# Patient Record
Sex: Male | Born: 1960 | Race: White | Smoking: Never smoker
Health system: Southern US, Community
[De-identification: ages and names within clinical notes are randomized; demographics above are authoritative.]

---

## 2011-07-06 ENCOUNTER — Ambulatory Visit: Payer: Worker's Compensation | Attending: Specialist | Admitting: Physical Therapy

## 2011-07-06 DIAGNOSIS — M542 Cervicalgia: Secondary | ICD-10-CM | POA: Insufficient documentation

## 2011-07-06 DIAGNOSIS — M25519 Pain in unspecified shoulder: Secondary | ICD-10-CM | POA: Insufficient documentation

## 2011-07-06 DIAGNOSIS — M25619 Stiffness of unspecified shoulder, not elsewhere classified: Secondary | ICD-10-CM | POA: Insufficient documentation

## 2011-07-06 DIAGNOSIS — M6281 Muscle weakness (generalized): Secondary | ICD-10-CM | POA: Insufficient documentation

## 2011-07-06 DIAGNOSIS — IMO0001 Reserved for inherently not codable concepts without codable children: Secondary | ICD-10-CM | POA: Insufficient documentation

## 2011-07-07 ENCOUNTER — Ambulatory Visit: Payer: Worker's Compensation | Admitting: Physical Therapy

## 2011-07-10 ENCOUNTER — Ambulatory Visit: Payer: Worker's Compensation | Admitting: Physical Therapy

## 2011-07-12 ENCOUNTER — Ambulatory Visit: Payer: Worker's Compensation | Admitting: Physical Therapy

## 2011-07-17 ENCOUNTER — Encounter: Payer: Worker's Compensation | Admitting: Physical Therapy

## 2011-07-18 ENCOUNTER — Ambulatory Visit: Payer: Worker's Compensation | Admitting: Physical Therapy

## 2011-07-19 ENCOUNTER — Encounter: Payer: Worker's Compensation | Admitting: Physical Therapy

## 2011-07-20 ENCOUNTER — Ambulatory Visit: Payer: Worker's Compensation | Admitting: Physical Therapy

## 2011-07-24 ENCOUNTER — Ambulatory Visit: Payer: Worker's Compensation | Admitting: Physical Therapy

## 2011-07-26 ENCOUNTER — Ambulatory Visit: Payer: Worker's Compensation | Attending: Specialist | Admitting: Physical Therapy

## 2011-07-26 DIAGNOSIS — M542 Cervicalgia: Secondary | ICD-10-CM | POA: Insufficient documentation

## 2011-07-26 DIAGNOSIS — M6281 Muscle weakness (generalized): Secondary | ICD-10-CM | POA: Insufficient documentation

## 2011-07-26 DIAGNOSIS — IMO0001 Reserved for inherently not codable concepts without codable children: Secondary | ICD-10-CM | POA: Insufficient documentation

## 2011-07-26 DIAGNOSIS — M25519 Pain in unspecified shoulder: Secondary | ICD-10-CM | POA: Insufficient documentation

## 2011-07-26 DIAGNOSIS — M25619 Stiffness of unspecified shoulder, not elsewhere classified: Secondary | ICD-10-CM | POA: Insufficient documentation

## 2011-07-31 ENCOUNTER — Ambulatory Visit: Payer: Worker's Compensation | Attending: Specialist | Admitting: Physical Therapy

## 2011-07-31 DIAGNOSIS — IMO0001 Reserved for inherently not codable concepts without codable children: Secondary | ICD-10-CM | POA: Insufficient documentation

## 2011-07-31 DIAGNOSIS — M542 Cervicalgia: Secondary | ICD-10-CM | POA: Insufficient documentation

## 2011-07-31 DIAGNOSIS — M25619 Stiffness of unspecified shoulder, not elsewhere classified: Secondary | ICD-10-CM | POA: Insufficient documentation

## 2011-07-31 DIAGNOSIS — M25519 Pain in unspecified shoulder: Secondary | ICD-10-CM | POA: Insufficient documentation

## 2011-07-31 DIAGNOSIS — M6281 Muscle weakness (generalized): Secondary | ICD-10-CM | POA: Insufficient documentation

## 2011-08-02 ENCOUNTER — Encounter: Payer: Worker's Compensation | Admitting: Physical Therapy

## 2011-08-03 ENCOUNTER — Ambulatory Visit: Payer: Worker's Compensation | Admitting: Physical Therapy

## 2011-08-07 ENCOUNTER — Ambulatory Visit: Payer: Worker's Compensation | Admitting: Physical Therapy

## 2011-08-24 ENCOUNTER — Ambulatory Visit: Payer: Worker's Compensation | Admitting: Physical Therapy

## 2011-08-28 ENCOUNTER — Ambulatory Visit: Payer: Worker's Compensation | Attending: Specialist | Admitting: Physical Therapy

## 2011-08-28 DIAGNOSIS — M542 Cervicalgia: Secondary | ICD-10-CM | POA: Insufficient documentation

## 2011-08-28 DIAGNOSIS — M6281 Muscle weakness (generalized): Secondary | ICD-10-CM | POA: Insufficient documentation

## 2011-08-28 DIAGNOSIS — IMO0001 Reserved for inherently not codable concepts without codable children: Secondary | ICD-10-CM | POA: Insufficient documentation

## 2011-08-28 DIAGNOSIS — M25519 Pain in unspecified shoulder: Secondary | ICD-10-CM | POA: Insufficient documentation

## 2011-08-28 DIAGNOSIS — M25619 Stiffness of unspecified shoulder, not elsewhere classified: Secondary | ICD-10-CM | POA: Insufficient documentation

## 2011-08-30 ENCOUNTER — Ambulatory Visit: Payer: Worker's Compensation | Admitting: Physical Therapy

## 2011-09-01 ENCOUNTER — Ambulatory Visit: Payer: Worker's Compensation | Admitting: Physical Therapy

## 2011-09-04 ENCOUNTER — Ambulatory Visit: Payer: Worker's Compensation | Admitting: Physical Therapy

## 2011-09-06 ENCOUNTER — Ambulatory Visit: Payer: Worker's Compensation | Admitting: Physical Therapy

## 2011-09-08 ENCOUNTER — Ambulatory Visit: Payer: Worker's Compensation | Admitting: Physical Therapy

## 2011-09-11 ENCOUNTER — Ambulatory Visit: Payer: Worker's Compensation | Admitting: Physical Therapy

## 2011-09-13 ENCOUNTER — Encounter: Payer: Worker's Compensation | Admitting: Physical Therapy

## 2011-09-14 ENCOUNTER — Ambulatory Visit: Payer: Worker's Compensation | Admitting: Physical Therapy

## 2011-09-15 ENCOUNTER — Encounter: Payer: Worker's Compensation | Admitting: Physical Therapy

## 2011-09-20 ENCOUNTER — Ambulatory Visit: Payer: Worker's Compensation | Admitting: Physical Therapy

## 2011-09-22 ENCOUNTER — Ambulatory Visit: Payer: Worker's Compensation | Admitting: Physical Therapy

## 2011-09-27 ENCOUNTER — Ambulatory Visit: Payer: Worker's Compensation | Attending: Specialist | Admitting: Physical Therapy

## 2011-09-27 DIAGNOSIS — M542 Cervicalgia: Secondary | ICD-10-CM | POA: Insufficient documentation

## 2011-09-27 DIAGNOSIS — M25519 Pain in unspecified shoulder: Secondary | ICD-10-CM | POA: Insufficient documentation

## 2011-09-27 DIAGNOSIS — M25619 Stiffness of unspecified shoulder, not elsewhere classified: Secondary | ICD-10-CM | POA: Insufficient documentation

## 2011-09-27 DIAGNOSIS — IMO0001 Reserved for inherently not codable concepts without codable children: Secondary | ICD-10-CM | POA: Insufficient documentation

## 2011-09-27 DIAGNOSIS — M6281 Muscle weakness (generalized): Secondary | ICD-10-CM | POA: Insufficient documentation

## 2011-09-29 ENCOUNTER — Ambulatory Visit: Payer: Worker's Compensation | Admitting: Physical Therapy

## 2011-10-02 ENCOUNTER — Ambulatory Visit: Payer: Worker's Compensation | Admitting: Physical Therapy

## 2011-10-04 ENCOUNTER — Ambulatory Visit: Payer: Worker's Compensation | Admitting: Physical Therapy

## 2011-10-06 ENCOUNTER — Ambulatory Visit: Payer: Worker's Compensation | Admitting: Physical Therapy

## 2011-10-09 ENCOUNTER — Ambulatory Visit: Payer: Worker's Compensation | Admitting: Physical Therapy

## 2011-10-10 ENCOUNTER — Encounter: Payer: Worker's Compensation | Admitting: Physical Therapy

## 2011-10-11 ENCOUNTER — Ambulatory Visit: Payer: Worker's Compensation | Admitting: Physical Therapy

## 2011-10-13 ENCOUNTER — Ambulatory Visit: Payer: Worker's Compensation | Admitting: Physical Therapy

## 2011-10-13 ENCOUNTER — Encounter: Payer: Worker's Compensation | Admitting: Physical Therapy

## 2011-10-16 ENCOUNTER — Encounter: Payer: Worker's Compensation | Admitting: Physical Therapy

## 2011-10-17 ENCOUNTER — Ambulatory Visit: Payer: Worker's Compensation | Admitting: Physical Therapy

## 2011-10-18 ENCOUNTER — Ambulatory Visit: Payer: Worker's Compensation | Admitting: Physical Therapy

## 2011-10-20 ENCOUNTER — Ambulatory Visit: Payer: Worker's Compensation | Admitting: Physical Therapy

## 2011-10-23 ENCOUNTER — Ambulatory Visit: Payer: Worker's Compensation | Admitting: Physical Therapy

## 2011-10-25 ENCOUNTER — Ambulatory Visit: Payer: Worker's Compensation | Admitting: Physical Therapy

## 2011-10-27 ENCOUNTER — Ambulatory Visit: Payer: Worker's Compensation | Attending: Specialist | Admitting: Physical Therapy

## 2011-10-27 DIAGNOSIS — M25619 Stiffness of unspecified shoulder, not elsewhere classified: Secondary | ICD-10-CM | POA: Insufficient documentation

## 2011-10-27 DIAGNOSIS — M25519 Pain in unspecified shoulder: Secondary | ICD-10-CM | POA: Insufficient documentation

## 2011-10-27 DIAGNOSIS — M542 Cervicalgia: Secondary | ICD-10-CM | POA: Insufficient documentation

## 2011-10-27 DIAGNOSIS — M6281 Muscle weakness (generalized): Secondary | ICD-10-CM | POA: Insufficient documentation

## 2011-10-27 DIAGNOSIS — IMO0001 Reserved for inherently not codable concepts without codable children: Secondary | ICD-10-CM | POA: Insufficient documentation

## 2011-10-30 ENCOUNTER — Ambulatory Visit: Payer: Worker's Compensation | Admitting: Physical Therapy

## 2011-11-01 ENCOUNTER — Ambulatory Visit: Payer: Worker's Compensation | Admitting: Physical Therapy

## 2011-11-03 ENCOUNTER — Ambulatory Visit: Payer: Worker's Compensation | Admitting: Physical Therapy

## 2011-11-06 ENCOUNTER — Ambulatory Visit: Payer: Worker's Compensation | Admitting: Physical Therapy

## 2011-11-08 ENCOUNTER — Encounter: Payer: Worker's Compensation | Admitting: Physical Therapy

## 2011-11-10 ENCOUNTER — Encounter: Payer: Worker's Compensation | Admitting: Physical Therapy

## 2011-11-13 ENCOUNTER — Encounter: Payer: Worker's Compensation | Admitting: Physical Therapy

## 2011-11-15 ENCOUNTER — Encounter: Payer: Worker's Compensation | Admitting: Physical Therapy

## 2011-11-17 ENCOUNTER — Encounter: Payer: Worker's Compensation | Admitting: Physical Therapy

## 2011-11-20 ENCOUNTER — Encounter: Payer: Worker's Compensation | Admitting: Physical Therapy

## 2011-11-22 ENCOUNTER — Encounter: Payer: Worker's Compensation | Admitting: Physical Therapy

## 2011-11-24 ENCOUNTER — Encounter: Payer: Worker's Compensation | Admitting: Physical Therapy

## 2014-04-17 ENCOUNTER — Other Ambulatory Visit: Payer: Self-pay | Admitting: Orthopedic Surgery

## 2014-04-17 DIAGNOSIS — M542 Cervicalgia: Secondary | ICD-10-CM

## 2014-05-06 NOTE — Discharge Instructions (Signed)
Myelogram Discharge Instructions  1. Go home and rest quietly for the next 24 hours.  It is important to lie flat for the next 24 hours.  Get up only to go to the restroom.  You may lie in the bed or on a couch on your back, your stomach, your left side or your right side.  You may have one pillow under your head.  You may have pillows between your knees while you are on your side or under your knees while you are on your back.  2. DO NOT drive today.  Recline the seat as far back as it will go, while still wearing your seat belt, on the way home.  3. You may get up to go to the bathroom as needed.  You may sit up for 10 minutes to eat.  You may resume your normal diet and medications unless otherwise indicated.  Drink lots of extra fluids today and tomorrow.  4. The incidence of headache, nausea, or vomiting is about 5% (one in 20 patients).  If you develop a headache, lie flat and drink plenty of fluids until the headache goes away.  Caffeinated beverages may be helpful.  If you develop severe nausea and vomiting or a headache that does not go away with flat bed rest, call [phone].  5. You may resume normal activities after your 24 hours of bed rest is over; however, do not exert yourself strongly or do any heavy lifting tomorrow. If when you get up you have a headache when standing, go back to bed and force fluids for another 24 hours.  6. Call your physician for a follow-up appointment.  The results of your myelogram will be sent directly to your physician by the following day.  7. If you have any questions or if complications develop after you arrive home, please call [phone].  Discharge instructions have been explained to the patient.  The patient, or the person responsible for the patient, fully understands these instructions.      May resume Trazodone and Qysimia on Aug. 14, 2015 after 11:00 am.

## 2014-05-07 ENCOUNTER — Ambulatory Visit
Admission: RE | Admit: 2014-05-07 | Discharge: 2014-05-07 | Disposition: A | Discharge status: Home / Self Care | Payer: Worker's Compensation | Source: Ambulatory Visit | Attending: Orthopedic Surgery | Admitting: Orthopedic Surgery

## 2014-05-07 ENCOUNTER — Ambulatory Visit
Admission: RE | Admit: 2014-05-07 | Discharge: 2014-05-07 | Disposition: A | Discharge status: Home / Self Care | Payer: Self-pay | Source: Ambulatory Visit | Attending: Orthopedic Surgery | Admitting: Orthopedic Surgery

## 2014-05-07 ENCOUNTER — Other Ambulatory Visit: Payer: Self-pay | Admitting: Orthopedic Surgery

## 2014-05-07 VITALS — BP 194/98 | HR 66 | Ht 69.0 in | Wt 320.0 lb

## 2014-05-07 DIAGNOSIS — M542 Cervicalgia: Secondary | ICD-10-CM

## 2014-05-07 DIAGNOSIS — R52 Pain, unspecified: Secondary | ICD-10-CM

## 2014-05-07 MED ORDER — DIAZEPAM 5 MG PO TABS
10.0000 mg | ORAL_TABLET | Freq: Once | ORAL | Status: AC
  Administered 2014-05-07: 10 mg via ORAL

## 2014-05-07 MED ORDER — MEPERIDINE HCL 100 MG/ML IJ SOLN
100.0000 mg | Freq: Once | INTRAMUSCULAR | Status: AC
  Administered 2014-05-07: 100 mg via INTRAMUSCULAR

## 2014-05-07 MED ORDER — ONDANSETRON HCL 4 MG/2ML IJ SOLN
4.0000 mg | Freq: Once | INTRAMUSCULAR | Status: AC
  Administered 2014-05-07: 4 mg via INTRAMUSCULAR

## 2014-05-07 MED ORDER — IOHEXOL 300 MG/ML  SOLN
10.0000 mL | Freq: Once | INTRAMUSCULAR | Status: AC | PRN
  Administered 2014-05-07: 10 mL via INTRATHECAL

## 2014-05-07 NOTE — Progress Notes (Signed)
Pt states he has been off Qsymia and Trazodone for at 2 days.  Discharge instructions explained to pt.

## 2015-06-12 IMAGING — RF DG MYELOGRAPHY LUMBAR INJ CERVICAL
10 series · 10 of 10 positions shown · non-contrast
Comparison: MRI cervical spine at [facility] 09/02/2013

CLINICAL DATA: Bilateral shoulder pain.  Headaches.
TECHNIQUE: Contiguous axial images were obtained through the Cervical spine
after the intrathecal infusion of infusion. Coronal and sagittal
reconstructions were obtained of the axial image sets.

[Series 1: (hospital) · 1 of 1 slices shown (1 of 2)]
[im 1/1]
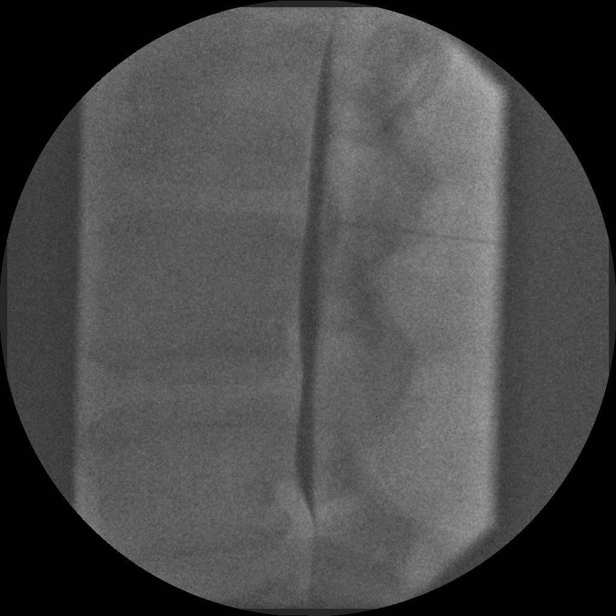

[Series 2: (hospital) · 1 of 1 slices shown (2 of 2)]
[im 1/1]
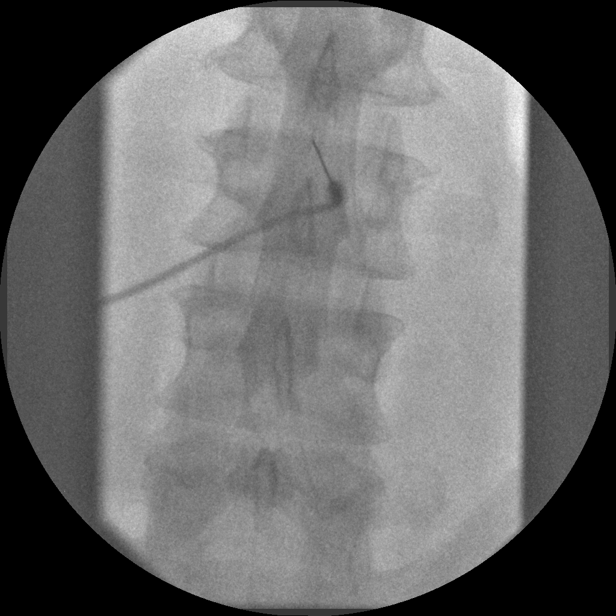

[Series 3: myelogram  white · 1 of 1 slices shown (1 of 8)]
[im 1/1]
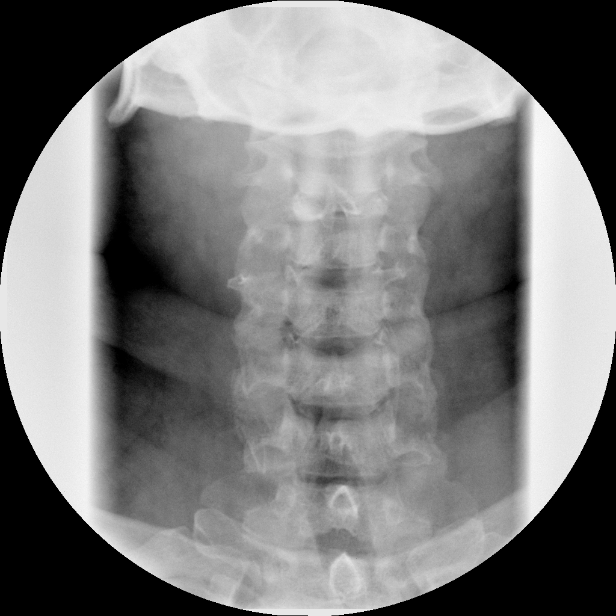

[Series 4: myelogram  white · 1 of 1 slices shown (2 of 8)]
[im 1/1]
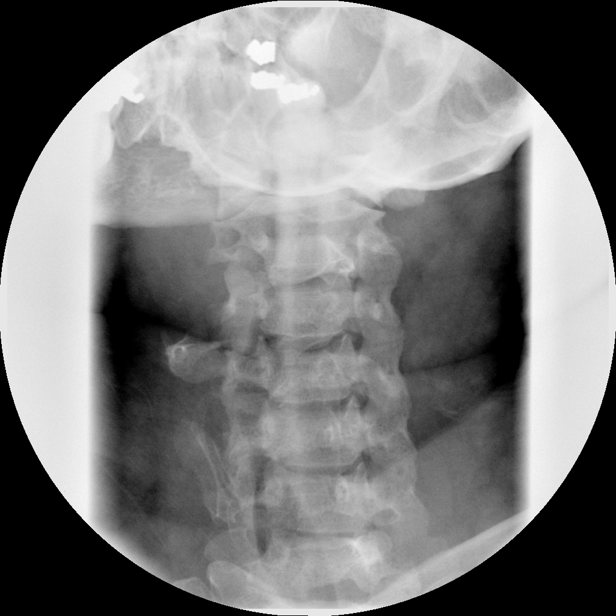

[Series 5: myelogram  white · 1 of 1 slices shown (3 of 8)]
[im 1/1]
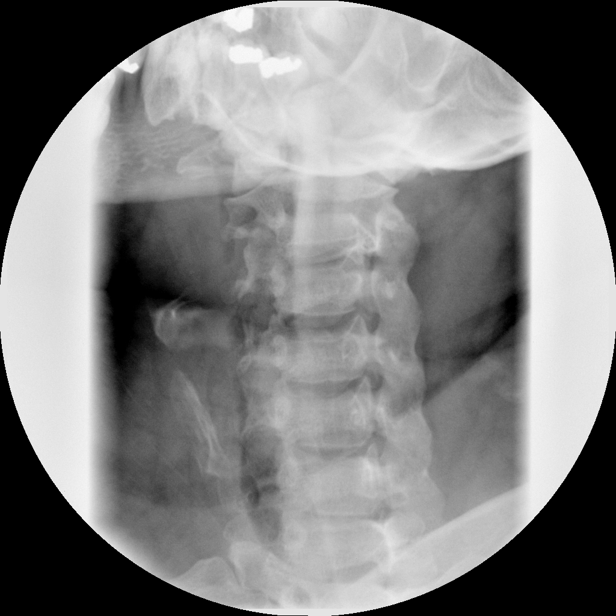

[Series 6: myelogram  white · 1 of 1 slices shown (4 of 8)]
[im 1/1]
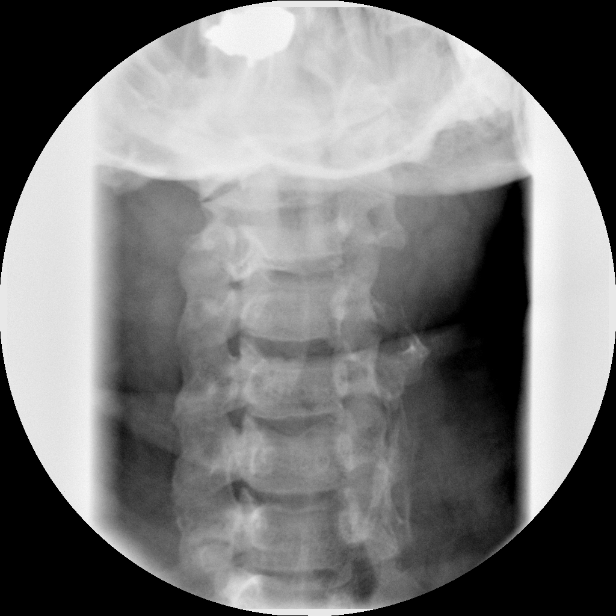

[Series 7: myelogram  white · 1 of 1 slices shown (5 of 8)]
[im 1/1]
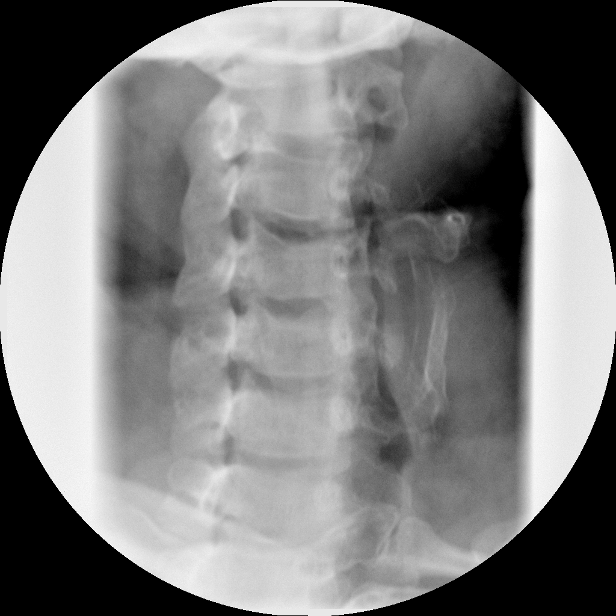

[Series 8: myelogram  white · 1 of 1 slices shown (6 of 8)]
[im 1/1]
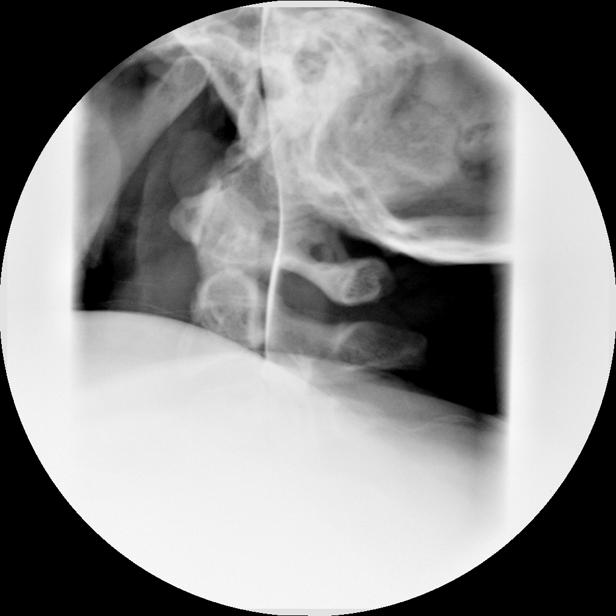

[Series 9: myelogram  white · 1 of 1 slices shown (7 of 8)]
[im 1/1]
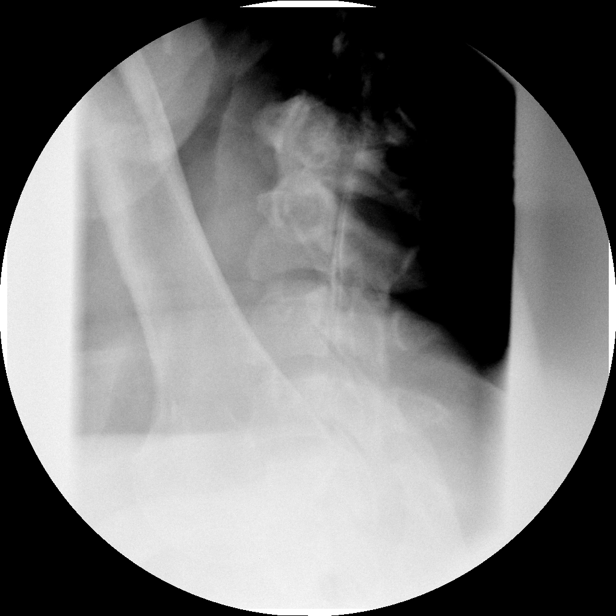

[Series 10: myelogram  white · 1 of 1 slices shown (8 of 8)]
[im 1/1]
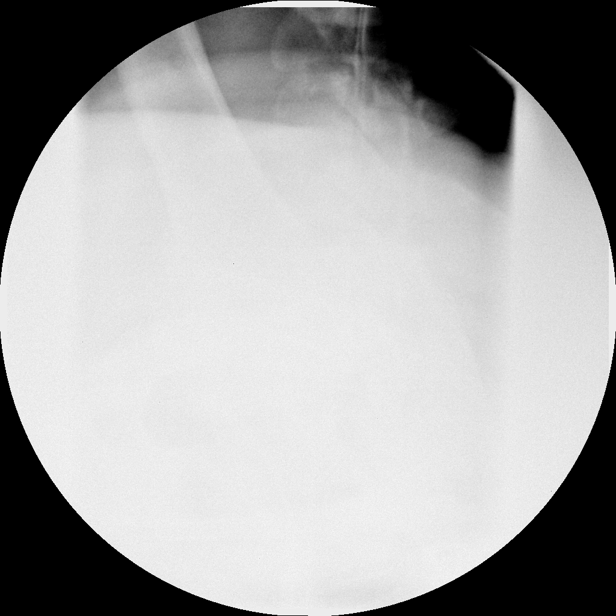

[10 of 10 positions shown; findings below may reference images not displayed]

FLUOROSCOPY TIME:  1 min 27 seconds

PROCEDURE:
LUMBAR PUNCTURE FOR CERVICAL MYELOGRAM

After thorough discussion of risks and benefits of the procedure
including bleeding, infection, injury to nerves, blood vessels,
adjacent structures as well as headache and CSF leak, written and
oral informed consent was obtained. Consent was obtained by Dr.
Mendez Dt. We discussed the high likelihood of obtaining a
diagnostic study.

Patient was positioned prone on the fluoroscopy table. Local
anesthesia was provided with 1% lidocaine without epinephrine after
prepped and draped in the usual sterile fashion. Puncture was
performed at L2-3 using a 3 1/2 inch 22-gauge spinal needle via
right paramedian approach. Using a single pass through the dura, the
needle was placed within the thecal sac, with return of clear CSF.
10 mL of [id number] was injected into the thecal sac, with normal
opacification of the nerve roots and cauda equina consistent with
free flow within the subarachnoid space. The patient was then moved
to the trendelenburg position and contrast flowed into the Cervical
spine region.

I personally performed the lumbar puncture and administered the
intrathecal contrast. I also personally supervised acquisition of
the myelogram images.
FINDINGS: CERVICAL MYELOGRAM FINDINGS:

Contrast is visualized in the cervical spine. Contrast somewhat
faint. No focal stenosis is evident. The lateral views are limited
by patient body habitus. Please see the CT report below.

CT CERVICAL MYELOGRAM FINDINGS:

The cervical spine is imaged from the skullbase through T1-2. There
is significant artifact at C6-7 and below. The craniocervical
junction is within normal limits. Vertebral body heights and
alignment are normal.

C2-3: A shallow disc protrusion is present without significant
stenosis.

C3-4: Minimal left-sided uncovertebral spurring is present. There is
no significant stenosis.

C4-5: A right paramedian disc protrusion is again noted. There is
partial effacement of the ventral CSF. The foramina are patent.

C5-6:  Negative.

C6-7:  Negative.

C7-T1:  Negative.
IMPRESSION: 1. Shallow central disc protrusion at C2-3 without significant
stenosis.
2. Minimal left-sided uncovertebral disease at C3-4 without
significant stenosis.
3. The most significant level remains C4-5 were a right paramedian
disc protrusion results and partial effacement of the ventral CSF
and mild central canal stenosis. The foramina are patent at this
level.
